# Patient Record
Sex: Male | Born: 2000 | Race: Black or African American | Hispanic: No | Marital: Single | Smoking: Never smoker
Health system: Southern US, Community
[De-identification: ages and names within clinical notes are randomized; demographics above are authoritative.]

## PROBLEM LIST (undated history)

## (undated) DIAGNOSIS — F1290 Cannabis use, unspecified, uncomplicated: Secondary | ICD-10-CM

## (undated) DIAGNOSIS — R112 Nausea with vomiting, unspecified: Secondary | ICD-10-CM

---
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
## 2021-09-18 ENCOUNTER — Emergency Department (HOSPITAL_BASED_OUTPATIENT_CLINIC_OR_DEPARTMENT_OTHER)
Admission: EM | Admit: 2021-09-18 | Discharge: 2021-09-18 | Disposition: A | Discharge status: Home / Self Care | Payer: Medicaid Other | Attending: Emergency Medicine | Admitting: Emergency Medicine

## 2021-09-18 ENCOUNTER — Other Ambulatory Visit: Payer: Self-pay

## 2021-09-18 ENCOUNTER — Encounter (HOSPITAL_BASED_OUTPATIENT_CLINIC_OR_DEPARTMENT_OTHER): Payer: Self-pay | Admitting: Emergency Medicine

## 2021-09-18 DIAGNOSIS — R1084 Generalized abdominal pain: Secondary | ICD-10-CM | POA: Insufficient documentation

## 2021-09-18 DIAGNOSIS — F1090 Alcohol use, unspecified, uncomplicated: Secondary | ICD-10-CM | POA: Insufficient documentation

## 2021-09-18 DIAGNOSIS — Z789 Other specified health status: Secondary | ICD-10-CM

## 2021-09-18 DIAGNOSIS — R112 Nausea with vomiting, unspecified: Secondary | ICD-10-CM | POA: Insufficient documentation

## 2021-09-18 DIAGNOSIS — D72829 Elevated white blood cell count, unspecified: Secondary | ICD-10-CM | POA: Diagnosis not present

## 2021-09-18 LAB — CBC WITH DIFFERENTIAL/PLATELET
Abs Immature Granulocytes: 0.04 10*3/uL (ref 0.00–0.07)
Basophils Absolute: 0 10*3/uL (ref 0.0–0.1)
Basophils Relative: 0 %
Eosinophils Absolute: 0 10*3/uL (ref 0.0–0.5)
Eosinophils Relative: 0 %
HCT: 44.7 % (ref 39.0–52.0)
Hemoglobin: 15.7 g/dL (ref 13.0–17.0)
Immature Granulocytes: 0 %
Lymphocytes Relative: 11 %
Lymphs Abs: 1.3 10*3/uL (ref 0.7–4.0)
MCH: 31.3 pg (ref 26.0–34.0)
MCHC: 35.1 g/dL (ref 30.0–36.0)
MCV: 89.2 fL (ref 80.0–100.0)
Monocytes Absolute: 0.5 10*3/uL (ref 0.1–1.0)
Monocytes Relative: 4 %
Neutro Abs: 10.2 10*3/uL — ABNORMAL HIGH (ref 1.7–7.7)
Neutrophils Relative %: 85 %
Platelets: 238 10*3/uL (ref 150–400)
RBC: 5.01 MIL/uL (ref 4.22–5.81)
RDW: 11.8 % (ref 11.5–15.5)
WBC: 12.1 10*3/uL — ABNORMAL HIGH (ref 4.0–10.5)
nRBC: 0 % (ref 0.0–0.2)

## 2021-09-18 LAB — COMPREHENSIVE METABOLIC PANEL
ALT: 24 U/L (ref 0–44)
AST: 27 U/L (ref 15–41)
Albumin: 5.4 g/dL — ABNORMAL HIGH (ref 3.5–5.0)
Alkaline Phosphatase: 76 U/L (ref 38–126)
Anion gap: 14 (ref 5–15)
BUN: 17 mg/dL (ref 6–20)
CO2: 24 mmol/L (ref 22–32)
Calcium: 10 mg/dL (ref 8.9–10.3)
Chloride: 102 mmol/L (ref 98–111)
Creatinine, Ser: 1.13 mg/dL (ref 0.61–1.24)
GFR, Estimated: >60 mL/min (ref >60)
Glucose, Bld: 91 mg/dL (ref 70–99)
Potassium: 3.7 mmol/L (ref 3.5–5.1)
Sodium: 140 mmol/L (ref 135–145)
Total Bilirubin: 0.7 mg/dL (ref 0.3–1.2)
Total Protein: 8.2 g/dL — ABNORMAL HIGH (ref 6.5–8.1)

## 2021-09-18 LAB — LIPASE, BLOOD: Lipase: <10 U/L — ABNORMAL LOW (ref 11–51)

## 2021-09-18 MED ORDER — ONDANSETRON 4 MG PO TBDP: 4.0000 mg | ORAL_TABLET | Freq: Three times a day (TID) | ORAL | 0 refills | Status: DC | PRN

## 2021-09-18 MED ORDER — LIDOCAINE VISCOUS HCL 2 % MT SOLN
15.0000 mL | Freq: Once | OROMUCOSAL | Status: AC
  Administered 2021-09-18: 15 mL via ORAL
  Filled 2021-09-18: qty 15

## 2021-09-18 MED ORDER — ALUM & MAG HYDROXIDE-SIMETH 200-200-20 MG/5ML PO SUSP
30.0000 mL | Freq: Once | ORAL | Status: AC
  Administered 2021-09-18: 30 mL via ORAL
  Filled 2021-09-18: qty 30

## 2021-09-18 MED ORDER — LACTATED RINGERS IV BOLUS
1000.0000 mL | Freq: Once | INTRAVENOUS | Status: AC
  Administered 2021-09-18: 1000 mL via INTRAVENOUS

## 2021-09-18 MED ORDER — ONDANSETRON HCL 4 MG/2ML IJ SOLN
4.0000 mg | Freq: Once | INTRAMUSCULAR | Status: AC
  Administered 2021-09-18: 4 mg via INTRAVENOUS
  Filled 2021-09-18: qty 2

## 2021-09-18 MED ORDER — DICYCLOMINE HCL 20 MG PO TABS: 20.0000 mg | ORAL_TABLET | Freq: Two times a day (BID) | ORAL | 0 refills | Status: DC | PRN

## 2021-09-18 MED ORDER — DICYCLOMINE HCL 10 MG PO CAPS
10.0000 mg | ORAL_CAPSULE | Freq: Once | ORAL | Status: AC
  Administered 2021-09-18: 10 mg via ORAL
  Filled 2021-09-18: qty 1

## 2021-09-18 NOTE — ED Triage Notes (Signed)
Lower abdominal pain, n/v ,saw some blood when vomiting, since 1 pm today. Been drinking liquor for 2 days, worried about alcohol poisoning, also smells of marijuana.

## 2021-09-18 NOTE — Discharge Instructions (Signed)
Take zofran as needed for nausea/vomiting Use bentyl as needed for abdominal pain/cramping Use ibuprofen as needed for pain I recommend you decrease your alcohol intake Return to the ER with any new, worsening, or concerning symptoms.

## 2021-09-18 NOTE — ED Provider Notes (Signed)
[facility] EMERGENCY DEPT Provider Note   CSN: [id number] Arrival date & time: 09/18/21  1656     History  Chief Complaint  Patient presents with   Abdominal Pain    Mitchell Barrett is a 21 y.o. male presenting for evaluation of nausea, vomiting, abd pain.   Pt states for the past 2 days he has been drinking alcohol heavily. Today he woke up with nausea, vomiting, and generalized abd pain. No fevers or chills. No change in urination. He is having loose stools. No sick contacts. He has not had anything to eat or drink today due to persisting nausea/vomiting. He has no other medical problems, takes no medications daily. He reports frequent retching today. Prior to coming to the ED, he had blood in his emesis. No large volume, more streaks.   HPI     Home Medications Prior to Admission medications   Medication Sig Start Date End Date Taking? Authorizing Provider  dicyclomine (BENTYL) 20 MG tablet Take 1 tablet (20 mg total) by mouth 2 (two) times daily as needed for spasms. 09/18/21  Yes Darica Goren, PA-C  ondansetron (ZOFRAN-ODT) 4 MG disintegrating tablet Take 1 tablet (4 mg total) by mouth every 8 (eight) hours as needed for nausea or vomiting. 09/18/21  Yes Caylea Foronda, PA-C  clindamycin (CLEOCIN) 300 MG capsule Take 1 capsule (300 mg total) by mouth 3 (three) times daily. X 10 days Patient not taking: Reported on 10/09/2018 03/01/14   Kristen Cardinal, NP  loperamide (IMODIUM) 2 MG capsule Take 1 capsule (2 mg total) by mouth 4 (four) times daily as needed for diarrhea or loose stools. 11/05/20   Davonna Belling, MD  ondansetron (ZOFRAN-ODT) 4 MG disintegrating tablet Take 1 tablet (4 mg total) by mouth every 8 (eight) hours as needed for nausea or vomiting. 11/05/20   Davonna Belling, MD  triamcinolone cream (KENALOG) 0.1 % Apply 1 application topically 3 (three) times daily. Patient not taking: Reported on 10/09/2018 03/01/14   Kristen Cardinal, NP       Allergies    Patient has no known allergies.    Review of Systems   Review of Systems  Gastrointestinal:  Positive for abdominal pain, diarrhea, nausea and vomiting.  All other systems reviewed and are negative.   Physical Exam Updated Vital Signs BP 129/79   Pulse 83   Temp 98.1 F (36.7 C)   Resp 18   SpO2 100%  Physical Exam Vitals and nursing note reviewed.  Constitutional:      General: He is not in acute distress.    Appearance: Normal appearance.  HENT:     Head: Normocephalic and atraumatic.  Eyes:     Conjunctiva/sclera: Conjunctivae normal.     Pupils: Pupils are equal, round, and reactive to light.  Cardiovascular:     Rate and Rhythm: Normal rate and regular rhythm.     Pulses: Normal pulses.  Pulmonary:     Effort: Pulmonary effort is normal. No respiratory distress.     Breath sounds: Normal breath sounds. No wheezing.     Comments: Speaking in full sentences.  Clear lung sounds in all fields. Abdominal:     General: There is no distension.     Palpations: Abdomen is soft. There is no mass.     Tenderness: There is abdominal tenderness. There is no guarding or rebound.     Comments: Diffuse ttp. No rigidity, guarding, distention. No rebound  Musculoskeletal:        General: Normal  range of motion.     Cervical back: Normal range of motion and neck supple.  Skin:    General: Skin is warm and dry.     Capillary Refill: Capillary refill takes less than 2 seconds.  Neurological:     Mental Status: He is alert and oriented to person, place, and time.  Psychiatric:        Mood and Affect: Mood and affect normal.        Speech: Speech normal.        Behavior: Behavior normal.     ED Results / Procedures / Treatments   Labs (all labs ordered are listed, but only abnormal results are displayed) Labs Reviewed  CBC WITH DIFFERENTIAL/PLATELET - Abnormal; Notable for the following components:      Result Value   WBC 12.1 (*)    Neutro Abs 10.2 (*)     All other components within normal limits  COMPREHENSIVE METABOLIC PANEL - Abnormal; Notable for the following components:   Total Protein 8.2 (*)    Albumin 5.4 (*)    All other components within normal limits  LIPASE, BLOOD - Abnormal; Notable for the following components:   Lipase <10 (*)    All other components within normal limits    EKG None  Radiology No results found.  Procedures Procedures    Medications Ordered in ED Medications  lactated ringers bolus 1,000 mL (0 mLs Intravenous Stopped 09/18/21 2043)  ondansetron (ZOFRAN) injection 4 mg (4 mg Intravenous Given 09/18/21 1939)  dicyclomine (BENTYL) capsule 10 mg (10 mg Oral Given 09/18/21 2143)  alum & mag hydroxide-simeth (MAALOX/MYLANTA) 200-200-20 MG/5ML suspension 30 mL (30 mLs Oral Given 09/18/21 2144)    And  lidocaine (XYLOCAINE) 2 % viscous mouth solution 15 mL (15 mLs Oral Given 09/18/21 2144)    ED Course/ Medical Decision Making/ A&P                           Medical Decision Making Amount and/or Complexity of Data Reviewed Labs: ordered.  Risk OTC drugs. Prescription drug management.    This patient presents to the ED for concern of n/v/abd pain. This involves an extensive number of treatment options, and is a complaint that carries with it a moderate risk of complications and morbidity.  The differential diagnosis includes alcohol induced gastritis, mallory weiss tear, pancreatitis, gastroenteritis.    Co morbidities:  Alcohol use    Lab Tests:  I ordered, and personally interpreted labs.  The pertinent results include:  overall reassuring labs.  Mild leukocytosis as 12. Kidney, liver, pancreatic function normal    Medicines ordered:  I ordered medication including zofran, fluids, gi cocktail, bentyl for n/v and abd pain  Reevaluation of the patient after these medicines showed that the patient improved. Pt tolerating PO without difficulty. No hematemesis  I have reviewed the patients home  medicines and have made adjustments as needed   Test Considered:  as pt is without fever and localized acute abd pain, low suspicion of acute intraabdominal infection or surgical emergency. No CTAP needed at this time.   Disposition:  After consideration of the diagnostic results and the patients response to treatment, I feel that the patent would benefit from symptomatic treatment at home. Will have pt f/u with pcp as needed. At this time, pt appears safe for d/c. Return precautions given. Pt states he understands and agrees to plan.  Final Clinical Impression(s) / ED Diagnoses Final diagnoses:  Nausea and vomiting, unspecified vomiting type  Alcohol use    Rx / DC Orders ED Discharge Orders          Ordered    ondansetron (ZOFRAN-ODT) 4 MG disintegrating tablet  Every 8 hours PRN        09/18/21 2142    dicyclomine (BENTYL) 20 MG tablet  2 times daily PRN        09/18/21 2142              Alveria Apley, PA-C 09/18/21 2210    Terald Sleeper, MD 09/18/21 2315

## 2021-09-18 NOTE — ED Notes (Signed)
Pt care taken, given nausea meds and fluids, still complaining of nausea and pain in abdomen.

## 2021-09-18 NOTE — ED Notes (Signed)
Gave patient gingerale about 20 min till 9, has kept it down, still complaining of abdominal pain.

## 2021-09-21 ENCOUNTER — Encounter (HOSPITAL_BASED_OUTPATIENT_CLINIC_OR_DEPARTMENT_OTHER): Payer: Self-pay | Admitting: Emergency Medicine

## 2021-09-21 ENCOUNTER — Emergency Department (HOSPITAL_BASED_OUTPATIENT_CLINIC_OR_DEPARTMENT_OTHER)
Admission: EM | Admit: 2021-09-21 | Discharge: 2021-09-21 | Disposition: A | Discharge status: Home / Self Care | Payer: Medicaid Other | Attending: Emergency Medicine | Admitting: Emergency Medicine

## 2021-09-21 ENCOUNTER — Other Ambulatory Visit: Payer: Self-pay

## 2021-09-21 DIAGNOSIS — R112 Nausea with vomiting, unspecified: Secondary | ICD-10-CM | POA: Diagnosis not present

## 2021-09-21 DIAGNOSIS — Z20822 Contact with and (suspected) exposure to covid-19: Secondary | ICD-10-CM | POA: Diagnosis not present

## 2021-09-21 DIAGNOSIS — F12988 Cannabis use, unspecified with other cannabis-induced disorder: Secondary | ICD-10-CM | POA: Diagnosis not present

## 2021-09-21 DIAGNOSIS — F12188 Cannabis abuse with other cannabis-induced disorder: Secondary | ICD-10-CM | POA: Diagnosis not present

## 2021-09-21 DIAGNOSIS — R1084 Generalized abdominal pain: Secondary | ICD-10-CM | POA: Diagnosis not present

## 2021-09-21 DIAGNOSIS — F1290 Cannabis use, unspecified, uncomplicated: Secondary | ICD-10-CM

## 2021-09-21 LAB — COMPREHENSIVE METABOLIC PANEL
ALT: 13 U/L (ref 0–44)
AST: 16 U/L (ref 15–41)
Albumin: 4.9 g/dL (ref 3.5–5.0)
Alkaline Phosphatase: 61 U/L (ref 38–126)
Anion gap: 12 (ref 5–15)
BUN: 13 mg/dL (ref 6–20)
CO2: 25 mmol/L (ref 22–32)
Calcium: 9.5 mg/dL (ref 8.9–10.3)
Chloride: 97 mmol/L — ABNORMAL LOW (ref 98–111)
Creatinine, Ser: 1.2 mg/dL (ref 0.61–1.24)
GFR, Estimated: >60 mL/min (ref >60)
Glucose, Bld: 156 mg/dL — ABNORMAL HIGH (ref 70–99)
Potassium: 3.2 mmol/L — ABNORMAL LOW (ref 3.5–5.1)
Sodium: 134 mmol/L — ABNORMAL LOW (ref 135–145)
Total Bilirubin: 1.2 mg/dL (ref 0.3–1.2)
Total Protein: 7.2 g/dL (ref 6.5–8.1)

## 2021-09-21 LAB — CBC
HCT: 43.4 % (ref 39.0–52.0)
Hemoglobin: 15.4 g/dL (ref 13.0–17.0)
MCH: 31 pg (ref 26.0–34.0)
MCHC: 35.5 g/dL (ref 30.0–36.0)
MCV: 87.3 fL (ref 80.0–100.0)
Platelets: 235 10*3/uL (ref 150–400)
RBC: 4.97 MIL/uL (ref 4.22–5.81)
RDW: 11.4 % — ABNORMAL LOW (ref 11.5–15.5)
WBC: 10.4 10*3/uL (ref 4.0–10.5)
nRBC: 0 % (ref 0.0–0.2)

## 2021-09-21 LAB — RESP PANEL BY RT-PCR (FLU A&B, COVID) ARPGX2
Influenza A by PCR: NEGATIVE
Influenza B by PCR: NEGATIVE
SARS Coronavirus 2 by RT PCR: NEGATIVE

## 2021-09-21 LAB — ETHANOL: Alcohol, Ethyl (B): <10 mg/dL (ref <10)

## 2021-09-21 LAB — LIPASE, BLOOD: Lipase: 12 U/L (ref 11–51)

## 2021-09-21 MED ORDER — HALOPERIDOL LACTATE 5 MG/ML IJ SOLN
1.0000 mg | Freq: Once | INTRAMUSCULAR | Status: AC
  Administered 2021-09-21: 1 mg via INTRAVENOUS
  Filled 2021-09-21: qty 1

## 2021-09-21 MED ORDER — POTASSIUM CHLORIDE CRYS ER 20 MEQ PO TBCR
40.0000 meq | EXTENDED_RELEASE_TABLET | Freq: Once | ORAL | Status: AC
  Administered 2021-09-21: 40 meq via ORAL
  Filled 2021-09-21: qty 2

## 2021-09-21 MED ORDER — SODIUM CHLORIDE 0.9 % IV BOLUS
1000.0000 mL | Freq: Once | INTRAVENOUS | Status: AC
  Administered 2021-09-21: 1000 mL via INTRAVENOUS

## 2021-09-21 MED ORDER — ONDANSETRON HCL 4 MG/2ML IJ SOLN
4.0000 mg | Freq: Once | INTRAMUSCULAR | Status: AC
  Administered 2021-09-21: 4 mg via INTRAVENOUS
  Filled 2021-09-21: qty 2

## 2021-09-21 NOTE — Discharge Instructions (Signed)
We have seen a lot of patients coming in with cannabinoid hyperemesis (increased vomiting) syndrome. To follow recover you should consider stopping marijuana use. It takes up to 3 months for marijuana to be completely out of your system so it may take a few weeks-months to feel 100% better. Improvement of symptoms does occur after stopping for 1-2 weeks. Follow up with primary care for further management.

## 2021-09-21 NOTE — ED Triage Notes (Signed)
Pt arrives to ED with c/o on-going nausea and vomiting after alcohol poisoning on 9/17. Was prescribed dicyclomine and Zofran w/o relief.

## 2021-09-21 NOTE — ED Notes (Signed)
Discharge instructions and follow up care reviewed and explained, pt verbalized understanding. Pt caox4 and ambulatory on d/c.  

## 2021-09-21 NOTE — ED Provider Notes (Signed)
[facility] EMERGENCY DEPT Provider Note   CSN: [id number] Arrival date & time: 09/21/21  1540     History  Chief Complaint  Patient presents with   Emesis   Nausea    Mitchell Barrett is a 21 y.o. male.  Patient is a 21 year old male presenting for complaints of vomiting.  Patient admits to nausea, vomiting, and generalized abdominal pain.  Patient was seen in the emergency department on 9/17 after episode of heavy drinking and concerns for alcohol poisoning.  He was discharged with Zofran.  Patient states symptoms are intermittent and recurrent.  Admits to smoking marijuana multiple times a day.  Patient denies any alcohol use at this time.  The history is provided by the patient. No language interpreter was used.  Emesis Associated symptoms: abdominal pain   Associated symptoms: no arthralgias, no chills, no cough, no fever and no sore throat        Home Medications Prior to Admission medications   Medication Sig Start Date End Date Taking? Authorizing Provider  clindamycin (CLEOCIN) 300 MG capsule Take 1 capsule (300 mg total) by mouth 3 (three) times daily. X 10 days Patient not taking: Reported on 10/09/2018 03/01/14   Lowanda Foster, NP  dicyclomine (BENTYL) 20 MG tablet Take 1 tablet (20 mg total) by mouth 2 (two) times daily as needed for spasms. 09/18/21   Caccavale, Sophia, PA-C  loperamide (IMODIUM) 2 MG capsule Take 1 capsule (2 mg total) by mouth 4 (four) times daily as needed for diarrhea or loose stools. 11/05/20   Benjiman Core, MD  ondansetron (ZOFRAN-ODT) 4 MG disintegrating tablet Take 1 tablet (4 mg total) by mouth every 8 (eight) hours as needed for nausea or vomiting. 11/05/20   Benjiman Core, MD  ondansetron (ZOFRAN-ODT) 4 MG disintegrating tablet Take 1 tablet (4 mg total) by mouth every 8 (eight) hours as needed for nausea or vomiting. 09/18/21   Caccavale, Sophia, PA-C  triamcinolone cream (KENALOG) 0.1 % Apply 1 application topically  3 (three) times daily. Patient not taking: Reported on 10/09/2018 03/01/14   Lowanda Foster, NP      Allergies    Amoxicillin    Review of Systems   Review of Systems  Constitutional:  Negative for chills and fever.  HENT:  Negative for ear pain and sore throat.   Eyes:  Negative for pain and visual disturbance.  Respiratory:  Negative for cough and shortness of breath.   Cardiovascular:  Negative for chest pain and palpitations.  Gastrointestinal:  Positive for abdominal pain, nausea and vomiting.  Genitourinary:  Negative for dysuria and hematuria.  Musculoskeletal:  Negative for arthralgias and back pain.  Skin:  Negative for color change and rash.  Neurological:  Negative for seizures and syncope.  All other systems reviewed and are negative.   Physical Exam Updated Vital Signs BP 108/71   Pulse 65   Temp 97.9 F (36.6 C) (Oral)   Resp 16   Ht 5\' 8"  (1.727 m)   Wt 63.5 kg   SpO2 96%   BMI 21.29 kg/m  Physical Exam Vitals and nursing note reviewed.  Constitutional:      General: He is not in acute distress.    Appearance: He is well-developed.  HENT:     Head: Normocephalic and atraumatic.  Eyes:     Conjunctiva/sclera: Conjunctivae normal.  Cardiovascular:     Rate and Rhythm: Normal rate and regular rhythm.     Heart sounds: No murmur heard. Pulmonary:  Effort: Pulmonary effort is normal. No respiratory distress.     Breath sounds: Normal breath sounds.  Abdominal:     Palpations: Abdomen is soft.     Tenderness: There is generalized abdominal tenderness. There is no guarding or rebound.  Musculoskeletal:        General: No swelling.     Cervical back: Neck supple.  Skin:    General: Skin is warm and dry.     Capillary Refill: Capillary refill takes less than 2 seconds.  Neurological:     Mental Status: He is alert.  Psychiatric:        Mood and Affect: Mood normal.     ED Results / Procedures / Treatments   Labs (all labs ordered are listed,  but only abnormal results are displayed) Labs Reviewed  COMPREHENSIVE METABOLIC PANEL - Abnormal; Notable for the following components:      Result Value   Sodium 134 (*)    Potassium 3.2 (*)    Chloride 97 (*)    Glucose, Bld 156 (*)    All other components within normal limits  CBC - Abnormal; Notable for the following components:   RDW 11.4 (*)    All other components within normal limits  RESP PANEL BY RT-PCR (FLU A&B, COVID) ARPGX2  LIPASE, BLOOD  ETHANOL    EKG None  Radiology No results found.  Procedures Procedures    Medications Ordered in ED Medications  sodium chloride 0.9 % bolus 1,000 mL (0 mLs Intravenous Stopped 09/21/21 1122)  ondansetron (ZOFRAN) injection 4 mg (4 mg Intravenous Given 09/21/21 1017)  haloperidol lactate (HALDOL) injection 1 mg (1 mg Intravenous Given 09/21/21 1017)  potassium chloride SA (KLOR-CON M) CR tablet 40 mEq (40 mEq Oral Given 09/21/21 1031)    ED Course/ Medical Decision Making/ A&P                           Medical Decision Making Amount and/or Complexity of Data Reviewed Labs: ordered.  Risk Prescription drug management.   2:60 AM  21 year old male presenting for complaints of vomiting.  Patient is alert x3, no acute distress, afebrile, stable vital signs.  Physical exam demonstrates soft abdomen with generalized discomfort.  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  IV fluids, Haldol, and Zofran given.  Laboratory studies stable liver profile, lipase, and renal function.  No signs or symptoms of sepsis.  Patient improvement of symptoms after treatment.  Patient recommended to stop marijuana use at this time.  Abdomen otherwise soft.  No distention or guarding.  Low suspicion any acute life-threatening etiologies of abdominal pain.  No further imaging recommended at this time.  Patient in no distress and overall condition improved here in the ED. Detailed discussions were had with the patient regarding  current findings, and need for close f/u with PCP or on call doctor. The patient has been instructed to return immediately if the symptoms worsen in any way for re-evaluation. Patient verbalized understanding and is in agreement with current care plan. All questions answered prior to discharge.         Final Clinical Impression(s) / ED Diagnoses Final diagnoses:  Cannabinoid hyperemesis syndrome    Rx / DC Orders ED Discharge Orders     None         Lianne Cure, DO 35/00/93 0024

## 2022-05-07 ENCOUNTER — Encounter (HOSPITAL_BASED_OUTPATIENT_CLINIC_OR_DEPARTMENT_OTHER): Payer: Self-pay | Admitting: Emergency Medicine

## 2022-05-07 ENCOUNTER — Emergency Department (HOSPITAL_BASED_OUTPATIENT_CLINIC_OR_DEPARTMENT_OTHER)
Admission: EM | Admit: 2022-05-07 | Discharge: 2022-05-07 | Disposition: A | Discharge status: Home / Self Care | Payer: Medicaid Other | Attending: Emergency Medicine | Admitting: Emergency Medicine

## 2022-05-07 ENCOUNTER — Other Ambulatory Visit: Payer: Self-pay

## 2022-05-07 DIAGNOSIS — F1290 Cannabis use, unspecified, uncomplicated: Secondary | ICD-10-CM | POA: Insufficient documentation

## 2022-05-07 DIAGNOSIS — R112 Nausea with vomiting, unspecified: Secondary | ICD-10-CM

## 2022-05-07 DIAGNOSIS — R9431 Abnormal electrocardiogram [ECG] [EKG]: Secondary | ICD-10-CM | POA: Diagnosis not present

## 2022-05-07 DIAGNOSIS — F12188 Cannabis abuse with other cannabis-induced disorder: Secondary | ICD-10-CM | POA: Diagnosis not present

## 2022-05-07 HISTORY — DX: Cannabis use, unspecified, uncomplicated: F12.90

## 2022-05-07 HISTORY — DX: Nausea with vomiting, unspecified: R11.2

## 2022-05-07 LAB — CBC
HCT: 44.3 % (ref 39.0–52.0)
Hemoglobin: 15.1 g/dL (ref 13.0–17.0)
MCH: 30.4 pg (ref 26.0–34.0)
MCHC: 34.1 g/dL (ref 30.0–36.0)
MCV: 89.1 fL (ref 80.0–100.0)
Platelets: 221 10*3/uL (ref 150–400)
RBC: 4.97 MIL/uL (ref 4.22–5.81)
RDW: 11.9 % (ref 11.5–15.5)
WBC: 11.3 10*3/uL — ABNORMAL HIGH (ref 4.0–10.5)
nRBC: 0 % (ref 0.0–0.2)

## 2022-05-07 LAB — COMPREHENSIVE METABOLIC PANEL
ALT: 27 U/L (ref 0–44)
AST: 21 U/L (ref 15–41)
Albumin: 4.8 g/dL (ref 3.5–5.0)
Alkaline Phosphatase: 76 U/L (ref 38–126)
Anion gap: 13 (ref 5–15)
BUN: 16 mg/dL (ref 6–20)
CO2: 22 mmol/L (ref 22–32)
Calcium: 9.8 mg/dL (ref 8.9–10.3)
Chloride: 102 mmol/L (ref 98–111)
Creatinine, Ser: 1.13 mg/dL (ref 0.61–1.24)
GFR, Estimated: >60 mL/min (ref >60)
Glucose, Bld: 111 mg/dL — ABNORMAL HIGH (ref 70–99)
Potassium: 3.8 mmol/L (ref 3.5–5.1)
Sodium: 137 mmol/L (ref 135–145)
Total Bilirubin: 0.7 mg/dL (ref 0.3–1.2)
Total Protein: 7.3 g/dL (ref 6.5–8.1)

## 2022-05-07 LAB — LIPASE, BLOOD: Lipase: 10 U/L — ABNORMAL LOW (ref 11–51)

## 2022-05-07 MED ORDER — ONDANSETRON 8 MG PO TBDP: 8.0000 mg | ORAL_TABLET | Freq: Three times a day (TID) | ORAL | 0 refills | Status: DC | PRN

## 2022-05-07 MED ORDER — ONDANSETRON 8 MG PO TBDP: 8.0000 mg | ORAL_TABLET | Freq: Three times a day (TID) | ORAL | 0 refills | Status: AC | PRN

## 2022-05-07 MED ORDER — SODIUM CHLORIDE 0.9 % IV BOLUS
1000.0000 mL | Freq: Once | INTRAVENOUS | Status: AC
  Administered 2022-05-07: 1000 mL via INTRAVENOUS

## 2022-05-07 MED ORDER — DROPERIDOL 2.5 MG/ML IJ SOLN
2.5000 mg | Freq: Once | INTRAMUSCULAR | Status: AC
  Administered 2022-05-07: 2.5 mg via INTRAVENOUS
  Filled 2022-05-07: qty 2

## 2022-05-07 NOTE — ED Provider Notes (Signed)
[facility] EMERGENCY Provider Note: Mitchell Dell, MD, FACEP  CSN: [id number] MRN: [id number] ARRIVAL: 05/07/22 at 0108 ROOM: DB010/DB010   CHIEF COMPLAINT  Vomiting   HISTORY OF PRESENT ILLNESS  05/07/22 1:25 AM Mitchell Barrett is a 22 y.o. male history of cannabinoid hyperemesis syndrome who admits to smoking marijuana yesterday.  He has been vomiting since about 4 PM yesterday.  He estimates he has vomited 4 times.  He is having generalized abdominal pain which he rates as a 7 out of 10.  He is not having diarrhea.   Past Medical History:  Diagnosis Date   Cannabinoid hyperemesis syndrome     No past surgical history on file.  No family history on file.  Social History   Tobacco Use   Smoking status: Never   Smokeless tobacco: Never  Substance Use Topics   Alcohol use: Yes    Comment: last 2 days cant count how much liquor   Drug use: Yes    Frequency: 2.0 times per week    Types: Marijuana    Prior to Admission medications   Medication Sig Start Date End Date Taking? Authorizing Provider  ondansetron (ZOFRAN-ODT) 8 MG disintegrating tablet Take 1 tablet (8 mg total) by mouth every 8 (eight) hours as needed for nausea or vomiting. 05/07/22  Yes Dorthy Hustead, MD    Allergies Amoxicillin   REVIEW OF SYSTEMS  Negative except as noted here or in the History of Present Illness.   PHYSICAL EXAMINATION  Initial Vital Signs Blood pressure (!) 106/54, pulse 73, temperature (!) 97.5 F (36.4 C), temperature source Oral, resp. rate 16, SpO2 100 %.  Examination General: Well-developed, well-nourished male in no acute distress; appearance consistent with age of record HENT: normocephalic; atraumatic Eyes: Normal appearance Neck: supple Heart: regular rate and rhythm Lungs: clear to auscultation bilaterally Abdomen: soft; nondistended; nontender; bowel sounds present Extremities: No deformity; full range of motion Neurologic: Awake, alert; motor function  intact in all extremities and symmetric; no facial droop Skin: Warm and dry Psychiatric: Flat affect   RESULTS  Summary of this visit's results, reviewed and interpreted by myself:   EKG Interpretation  Date/Time:  Sunday May 07 2022 01:47:29 EDT Ventricular Rate:  75 PR Interval:  139 QRS Duration: 88 QT Interval:  388 QTC Calculation: 434 R Axis:   90 Text Interpretation: Sinus arrhythmia Consider left atrial enlargement Borderline right axis deviation ST elev, probable normal early repol pattern No previous ECGs available Confirmed by Paula Libra ([id number]) on 05/07/2022 1:48:41 AM       Laboratory Studies: Results for orders placed or performed during the hospital encounter of 05/07/22 (from the past 24 hour(s))  CBC     Status: Abnormal   Collection Time: 05/07/22  1:24 AM  Result Value Ref Range   WBC 11.3 (H) 4.0 - 10.5 K/uL   RBC 4.97 4.22 - 5.81 MIL/uL   Hemoglobin 15.1 13.0 - 17.0 g/dL   HCT 65.7 84.6 - 96.2 %   MCV 89.1 80.0 - 100.0 fL   MCH 30.4 26.0 - 34.0 pg   MCHC 34.1 30.0 - 36.0 g/dL   RDW 95.2 84.1 - 32.4 %   Platelets 221 150 - 400 K/uL   nRBC 0.0 0.0 - 0.2 %  Comprehensive metabolic panel     Status: Abnormal   Collection Time: 05/07/22  1:24 AM  Result Value Ref Range   Sodium 137 135 - 145 mmol/L   Potassium 3.8 3.5 - 5.1 mmol/L  Chloride 102 98 - 111 mmol/L   CO2 22 22 - 32 mmol/L   Glucose, Bld 111 (H) 70 - 99 mg/dL   BUN 16 6 - 20 mg/dL   Creatinine, Ser 1.61 0.61 - 1.24 mg/dL   Calcium 9.8 8.9 - 09.6 mg/dL   Total Protein 7.3 6.5 - 8.1 g/dL   Albumin 4.8 3.5 - 5.0 g/dL   AST 21 15 - 41 U/L   ALT 27 0 - 44 U/L   Alkaline Phosphatase 76 38 - 126 U/L   Total Bilirubin 0.7 0.3 - 1.2 mg/dL   GFR, Estimated >04 >54 mL/min   Anion gap 13 5 - 15  Lipase, blood     Status: Abnormal   Collection Time: 05/07/22  1:24 AM  Result Value Ref Range   Lipase 10 (L) 11 - 51 U/L   Imaging Studies: No results found.  ED COURSE and MDM  Nursing  notes, initial and subsequent vitals signs, including pulse oximetry, reviewed and interpreted by myself.  Vitals:   05/07/22 0118 05/07/22 0145 05/07/22 0200  BP: (!) 106/54 117/66 116/68  Pulse: 73 74 60  Resp: 16 13 19   Temp: (!) 97.5 F (36.4 C)    TempSrc: Oral    SpO2: 100% 99% 100%   Medications  sodium chloride 0.9 % bolus 1,000 mL (0 mLs Intravenous Stopped 05/07/22 0237)  droperidol (INAPSINE) 2.5 MG/ML injection 2.5 mg (2.5 mg Intravenous Given 05/07/22 0145)   3:10 AM Patient is nausea is controlled and he is able to drink fluids without vomiting.  His pain is improved significantly although some pain remains.  I do not wish to give narcotics in the setting of cannabis abuse as this only adds another potential drug of abuse.  Presentation is consistent with cannabinoid hyperemesis syndrome.   PROCEDURES  Procedures   ED DIAGNOSES     ICD-10-CM   1. Cannabinoid hyperemesis syndrome  R11.2    F12.90          Paula Libra, MD 05/07/22 [id number]

## 2022-05-07 NOTE — ED Notes (Signed)
Patient states "feeling better"

## 2022-05-07 NOTE — ED Triage Notes (Signed)
Pt reports vomiting x 4 since 1600.Mitchell Barrett Pt reports he has been diagnosed and treated in past for cannabinoid emesis and does report marijuana use today. Pt also reports generalizes abdominal pain associated with vomiting only.

## 2022-05-08 ENCOUNTER — Emergency Department (HOSPITAL_COMMUNITY)
Admission: EM | Admit: 2022-05-08 | Discharge: 2022-05-08 | Disposition: A | Discharge status: Home / Self Care | Payer: Medicaid Other | Attending: Emergency Medicine | Admitting: Emergency Medicine

## 2022-05-08 ENCOUNTER — Emergency Department (HOSPITAL_COMMUNITY): Payer: Medicaid Other

## 2022-05-08 ENCOUNTER — Encounter (HOSPITAL_COMMUNITY): Payer: Self-pay

## 2022-05-08 ENCOUNTER — Other Ambulatory Visit: Payer: Self-pay

## 2022-05-08 DIAGNOSIS — R1115 Cyclical vomiting syndrome unrelated to migraine: Secondary | ICD-10-CM | POA: Insufficient documentation

## 2022-05-08 DIAGNOSIS — R112 Nausea with vomiting, unspecified: Secondary | ICD-10-CM | POA: Diagnosis present

## 2022-05-08 DIAGNOSIS — R109 Unspecified abdominal pain: Secondary | ICD-10-CM | POA: Diagnosis not present

## 2022-05-08 LAB — COMPREHENSIVE METABOLIC PANEL
ALT: 27 U/L (ref 0–44)
AST: 20 U/L (ref 15–41)
Albumin: 4.7 g/dL (ref 3.5–5.0)
Alkaline Phosphatase: 78 U/L (ref 38–126)
Anion gap: 9 (ref 5–15)
BUN: 15 mg/dL (ref 6–20)
CO2: 24 mmol/L (ref 22–32)
Calcium: 9.4 mg/dL (ref 8.9–10.3)
Chloride: 103 mmol/L (ref 98–111)
Creatinine, Ser: 1.23 mg/dL (ref 0.61–1.24)
GFR, Estimated: >60 mL/min (ref >60)
Glucose, Bld: 116 mg/dL — ABNORMAL HIGH (ref 70–99)
Potassium: 3.6 mmol/L (ref 3.5–5.1)
Sodium: 136 mmol/L (ref 135–145)
Total Bilirubin: 1.1 mg/dL (ref 0.3–1.2)
Total Protein: 7.9 g/dL (ref 6.5–8.1)

## 2022-05-08 LAB — CBC
HCT: 46.6 % (ref 39.0–52.0)
Hemoglobin: 15.8 g/dL (ref 13.0–17.0)
MCH: 30.2 pg (ref 26.0–34.0)
MCHC: 33.9 g/dL (ref 30.0–36.0)
MCV: 88.9 fL (ref 80.0–100.0)
Platelets: 234 10*3/uL (ref 150–400)
RBC: 5.24 MIL/uL (ref 4.22–5.81)
RDW: 11.8 % (ref 11.5–15.5)
WBC: 10.3 10*3/uL (ref 4.0–10.5)
nRBC: 0 % (ref 0.0–0.2)

## 2022-05-08 LAB — LIPASE, BLOOD: Lipase: 32 U/L (ref 11–51)

## 2022-05-08 MED ORDER — DROPERIDOL 2.5 MG/ML IJ SOLN
1.2500 mg | Freq: Once | INTRAMUSCULAR | Status: AC
  Administered 2022-05-08: 1.25 mg via INTRAVENOUS
  Filled 2022-05-08: qty 2

## 2022-05-08 MED ORDER — ONDANSETRON HCL 4 MG/2ML IJ SOLN
4.0000 mg | Freq: Once | INTRAMUSCULAR | Status: AC | PRN
  Administered 2022-05-08: 4 mg via INTRAVENOUS
  Filled 2022-05-08: qty 2

## 2022-05-08 MED ORDER — METOCLOPRAMIDE HCL 10 MG PO TABS: 10.0000 mg | ORAL_TABLET | Freq: Four times a day (QID) | ORAL | 0 refills | Status: AC | PRN

## 2022-05-08 MED ORDER — LACTATED RINGERS IV BOLUS
1000.0000 mL | Freq: Once | INTRAVENOUS | Status: AC
  Administered 2022-05-08: 1000 mL via INTRAVENOUS

## 2022-05-08 NOTE — ED Notes (Signed)
Unsuccessful IV attempt x2. Charge RN asked to attempt.  

## 2022-05-08 NOTE — ED Triage Notes (Addendum)
Pt c/o generalized abdominal pain and n/v x3 days.  Pain score 8/10.  Pt reports "I think it's from the Percocets."  Pt reports using marijuana and Percocet recreationally.  Pt was previously diagnosed w/ cannabinoid hyperemesis syndrome.    Pt was seen at [facility] for yesterday for same, but sts he "wasn't told anything."

## 2022-05-08 NOTE — ED Provider Notes (Signed)
[place] EMERGENCY DEPARTMENT AT [facility] Provider Note   CSN: [id number] Arrival date & time: 05/08/22  6440     History  Chief Complaint  Patient presents with   Abdominal Pain   Emesis    Mitchell Barrett is a 22 y.o. male.  HPI Patient presents roughly 30 hours after last being seen for similar concerns.  He is accompanied by his mother, where as he was not during that visit.  He notes that he has nausea, vomiting, with upper abdominal discomfort occasionally.  He uses both Percocet and marijuana, recreationally. No history of abdominal surgery, chest pain.    Home Medications Prior to Admission medications   Medication Sig Start Date End Date Taking? Authorizing Provider  metoCLOPramide (REGLAN) 10 MG tablet Take 1 tablet (10 mg total) by mouth every 6 (six) hours as needed for refractory nausea / vomiting. 05/08/22  Yes Gerhard Munch, MD  ondansetron (ZOFRAN-ODT) 8 MG disintegrating tablet Take 1 tablet (8 mg total) by mouth every 8 (eight) hours as needed for nausea or vomiting. 05/07/22   Molpus, Jonny Ruiz, MD      Allergies    Amoxicillin    Review of Systems   Review of Systems  All other systems reviewed and are negative.   Physical Exam Updated Vital Signs BP 136/89 (BP Location: Left Arm)   Pulse (!) 58   Temp 97.9 F (36.6 C) (Oral)   Resp 18   Ht 5\' 8"  (1.727 m)   Wt 63.5 kg   SpO2 100%   BMI 21.29 kg/m  Physical Exam Vitals and nursing note reviewed.  Constitutional:      General: He is not in acute distress.    Appearance: He is well-developed.  HENT:     Head: Normocephalic and atraumatic.  Eyes:     Conjunctiva/sclera: Conjunctivae normal.  Cardiovascular:     Rate and Rhythm: Normal rate and regular rhythm.  Pulmonary:     Effort: Pulmonary effort is normal. No respiratory distress.     Breath sounds: No stridor.  Abdominal:     General: There is no distension.  Skin:    General: Skin is warm and dry.  Neurological:      Mental Status: He is alert and oriented to person, place, and time.     ED Results / Procedures / Treatments   Labs (all labs ordered are listed, but only abnormal results are displayed) Labs Reviewed  COMPREHENSIVE METABOLIC PANEL - Abnormal; Notable for the following components:      Result Value   Glucose, Bld 116 (*)    All other components within normal limits  LIPASE, BLOOD  CBC  URINALYSIS, ROUTINE W REFLEX MICROSCOPIC    EKG None  Radiology DG Abdomen Acute W/Chest  Result Date: 05/08/2022 CLINICAL DATA:  Abdominal pain EXAM: DG ABDOMEN ACUTE WITH 1 VIEW CHEST COMPARISON:  None Available. FINDINGS: No pleural effusion. Pneumothorax. Normal cardiac and mediastinal contours. No focal airspace opacity. No radiographically apparent displaced rib fractures. Paucity of small bowel gas limits the ability to assess for bowel obstruction. Within this limitation, no dilated loops of bowel are visualized. Moderate colonic stool burden no acute osseous abnormality. Assessment of the sacrum is limited due to overlying bowel gas. IMPRESSION: 1. No acute cardiopulmonary disease. 2. Nonobstructive bowel gas pattern.  Moderate colonic stool burden. Electronically Signed   By: Lorenza Cambridge M.D.   On: 05/08/2022 11:48    Procedures Procedures    Medications Ordered in  ED Medications  ondansetron (ZOFRAN) injection 4 mg (4 mg Intravenous Given 05/08/22 1104)  lactated ringers bolus 1,000 mL (1,000 mLs Intravenous New Bag/Given 05/08/22 1103)  droperidol (INAPSINE) 2.5 MG/ML injection 1.25 mg (1.25 mg Intravenous Given 05/08/22 1104)    ED Course/ Medical Decision Making/ A&P                             Medical Decision Making Amount and/or Complexity of Data Reviewed Labs: ordered. Radiology: ordered.  Risk Prescription drug management.   1:06 PM With his parents present, after obtaining consent, we discussed all findings which are generally reassuring, mild dehydration, acute  dyspnea ascites, some suspicion for cannabinoid hyperemesis syndrome.  He has improved markedly here, after lengthy conversation with all findings, patient discharged in stable condition.        Final Clinical Impression(s) / ED Diagnoses Final diagnoses:  Cyclical vomiting syndrome not associated with migraine    Rx / DC Orders ED Discharge Orders          Ordered    metoCLOPramide (REGLAN) 10 MG tablet  Every 6 hours PRN        05/08/22 1305              Gerhard Munch, MD 05/08/22 1307

## 2022-05-08 NOTE — Discharge Instructions (Signed)
As discussed, today's evaluation has been generally reassuring.  Your episodic nausea and vomiting may be due to his continued marijuana use.  Please monitor your condition carefully and follow-up with our gastroenterology colleagues if needed.

## 2022-09-08 ENCOUNTER — Emergency Department (HOSPITAL_BASED_OUTPATIENT_CLINIC_OR_DEPARTMENT_OTHER)
Admission: EM | Admit: 2022-09-08 | Discharge: 2022-09-08 | Disposition: A | Discharge status: Home / Self Care | Payer: Medicaid Other | Attending: Emergency Medicine | Admitting: Emergency Medicine

## 2022-09-08 ENCOUNTER — Encounter (HOSPITAL_BASED_OUTPATIENT_CLINIC_OR_DEPARTMENT_OTHER): Payer: Self-pay | Admitting: Emergency Medicine

## 2022-09-08 ENCOUNTER — Other Ambulatory Visit: Payer: Self-pay

## 2022-09-08 DIAGNOSIS — Z202 Contact with and (suspected) exposure to infections with a predominantly sexual mode of transmission: Secondary | ICD-10-CM | POA: Diagnosis not present

## 2022-09-08 DIAGNOSIS — N4889 Other specified disorders of penis: Secondary | ICD-10-CM | POA: Diagnosis present

## 2022-09-08 DIAGNOSIS — Z711 Person with feared health complaint in whom no diagnosis is made: Secondary | ICD-10-CM

## 2022-09-08 MED ORDER — CEFTRIAXONE SODIUM 500 MG IJ SOLR
500.0000 mg | INTRAMUSCULAR | Status: DC
  Administered 2022-09-08: 500 mg via INTRAMUSCULAR
  Filled 2022-09-08: qty 500

## 2022-09-08 MED ORDER — DOXYCYCLINE HYCLATE 100 MG PO CAPS: 100.0000 mg | ORAL_CAPSULE | Freq: Two times a day (BID) | ORAL | 0 refills | Status: AC

## 2022-09-08 MED ORDER — DOXYCYCLINE HYCLATE 100 MG PO TABS
100.0000 mg | ORAL_TABLET | Freq: Once | ORAL | Status: AC
  Administered 2022-09-08: 100 mg via ORAL
  Filled 2022-09-08: qty 1

## 2022-09-08 NOTE — ED Triage Notes (Signed)
Pt in with request for STI testing. Reports he first noticed a bump on his penis yesterday. Denies any pain with urination or discharge. Unprotected intercourse 3 days ago

## 2022-09-08 NOTE — ED Provider Notes (Signed)
[place] EMERGENCY DEPARTMENT AT [facility] Provider Note   CSN: [id number] Arrival date & time: 09/08/22  0116     History  Chief Complaint  Patient presents with   STI testing    Mitchell Barrett is a 22 y.o. male.  HPI     This is a 22 year old male who presents with concern for penile lesion.  Patient reports he noticed a lump on his penis.  Does report a new sexual partner but reports that he always uses condoms.  No dysuria or penile discharge.  No prior history of lumps, bumps, lesions on his penis.  Home Medications Prior to Admission medications   Medication Sig Start Date End Date Taking? Authorizing Provider  doxycycline (VIBRAMYCIN) 100 MG capsule Take 1 capsule (100 mg total) by mouth 2 (two) times daily. 09/08/22  Yes Ailynn Gow, Mayer Masker, MD  metoCLOPramide (REGLAN) 10 MG tablet Take 1 tablet (10 mg total) by mouth every 6 (six) hours as needed for refractory nausea / vomiting. 05/08/22   Gerhard Munch, MD  ondansetron (ZOFRAN-ODT) 8 MG disintegrating tablet Take 1 tablet (8 mg total) by mouth every 8 (eight) hours as needed for nausea or vomiting. 05/07/22   Molpus, John, MD      Allergies    Amoxicillin    Review of Systems   Review of Systems  Genitourinary:  Negative for dysuria, penile discharge and penile pain.  All other systems reviewed and are negative.   Physical Exam Updated Vital Signs BP 129/77   Pulse 75   Temp 97.9 F (36.6 C) (Oral)   Resp 17   Wt 67.1 kg   SpO2 100%   BMI 22.50 kg/m  Physical Exam Vitals and nursing note reviewed. Exam conducted with a chaperone present.  Constitutional:      Appearance: He is well-developed. He is not ill-appearing.  HENT:     Head: Normocephalic and atraumatic.  Eyes:     Pupils: Pupils are equal, round, and reactive to light.  Cardiovascular:     Rate and Rhythm: Normal rate and regular rhythm.  Pulmonary:     Effort: Pulmonary effort is normal. No respiratory distress.   Abdominal:     Palpations: Abdomen is soft.  Genitourinary:    Comments: Normal circumcised penis, no obvious lesions or ulcerations, area of concern is at the base of a hair follicle, no adjacent redness or erythema Musculoskeletal:     Cervical back: Neck supple.  Skin:    General: Skin is warm and dry.  Neurological:     Mental Status: He is alert and oriented to person, place, and time.     ED Results / Procedures / Treatments   Labs (all labs ordered are listed, but only abnormal results are displayed) Labs Reviewed  GC/CHLAMYDIA PROBE AMP ([place]) NOT AT [facility]    EKG None  Radiology No results found.  Procedures Procedures    Medications Ordered in ED Medications  cefTRIAXone (ROCEPHIN) injection 500 mg (500 mg Intramuscular Given 09/08/22 0145)  doxycycline (VIBRA-TABS) tablet 100 mg (100 mg Oral Given 09/08/22 0145)    ED Course/ Medical Decision Making/ A&P                                 Medical Decision Making Risk Prescription drug management.   This patient presents to the ED for concern of penile lesion, this involves an extensive number of treatment options,  and is a complaint that carries with it a high risk of complications and morbidity.  I considered the following differential and admission for this acute, potentially life threatening condition.  The differential diagnosis includes STD, varicosity, folliculitis  MDM:    This is a 22 year old male who presents with concern for a new bump on his penis.  He is nontoxic and vital signs are reassuring.  Area in question does not appear ulcerated or classic for herpes or HPV.  It is at the base of a hair follicle and could be an irritated hair follicle or early folliculitis.  No signs or symptoms of cellulitis or bacterial infection.  Patient does wish to be tested and treated for other STDs.  Gonorrhea and Chlamydia testing were sent.  We discussed safe sex practices.  (Labs, imaging,  consults)  Labs: I Ordered, and personally interpreted labs.  The pertinent results include: Gonorrhea and Chlamydia testing pending  Imaging Studies ordered: I ordered imaging studies including none I independently visualized and interpreted imaging. I agree with the radiologist interpretation  Additional history obtained from chart review.  External records from outside source obtained and reviewed including prior evaluations  Cardiac Monitoring: The patient was not maintained on a cardiac monitor.  If on the cardiac monitor, I personally viewed and interpreted the cardiac monitored which showed an underlying rhythm of: N/A  Reevaluation: After the interventions noted above, I reevaluated the patient and found that they have :stayed the same  Social Determinants of Health:  lives independently  Disposition: Discharge  Co morbidities that complicate the patient evaluation  Past Medical History:  Diagnosis Date   Cannabinoid hyperemesis syndrome      Medicines Meds ordered this encounter  Medications   cefTRIAXone (ROCEPHIN) injection 500 mg    Order Specific Question:   Antibiotic Indication:    Answer:   STD   doxycycline (VIBRA-TABS) tablet 100 mg   doxycycline (VIBRAMYCIN) 100 MG capsule    Sig: Take 1 capsule (100 mg total) by mouth 2 (two) times daily.    Dispense:  20 capsule    Refill:  0    I have reviewed the patients home medicines and have made adjustments as needed  Problem List / ED Course: Problem List Items Addressed This Visit   None Visit Diagnoses     Concern about STD in male without diagnosis    -  Primary                   Final Clinical Impression(s) / ED Diagnoses Final diagnoses:  Concern about STD in male without diagnosis    Rx / DC Orders ED Discharge Orders          Ordered    doxycycline (VIBRAMYCIN) 100 MG capsule  2 times daily        09/08/22 0152              Shon Baton, MD 09/08/22 [phone]

## 2022-09-08 NOTE — Discharge Instructions (Signed)
You were seen today with concern for possible STD.  You were tested and treated.  Make sure that you are always using condoms.  If any of your testing returns positive, you will be informed.  Abstain from sexual activity for the next 10 days.

## 2022-09-11 LAB — GC/CHLAMYDIA PROBE AMP (~~LOC~~) NOT AT ARMC
Chlamydia: NEGATIVE
Comment: NEGATIVE
Comment: NORMAL
Neisseria Gonorrhea: NEGATIVE
